# Patient Record
Sex: Female | Born: 2000 | Race: White | Hispanic: No | Marital: Single | Smoking: Never smoker
Health system: Southern US, Community
[De-identification: ages and names within clinical notes are randomized; demographics above are authoritative.]

## PROBLEM LIST (undated history)

## (undated) DIAGNOSIS — R55 Syncope and collapse: Secondary | ICD-10-CM

## (undated) DIAGNOSIS — F909 Attention-deficit hyperactivity disorder, unspecified type: Secondary | ICD-10-CM

## (undated) DIAGNOSIS — F419 Anxiety disorder, unspecified: Secondary | ICD-10-CM

## (undated) DIAGNOSIS — T753XXA Motion sickness, initial encounter: Secondary | ICD-10-CM

## (undated) HISTORY — PX: WISDOM TOOTH EXTRACTION: SHX21

---
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
## 2018-07-02 IMAGING — CT CT NECK W/ CM
4 of 5 series · 14 of 33 positions shown, 16 images · IV contrast (iopamidol)
Comparison: None.

CLINICAL DATA: Diagnosis with tonsillitis/pharyngitis yesterday.
Trouble swallowing.

EXAM:
CT NECK WITH CONTRAST
TECHNIQUE: Multidetector CT imaging of the neck was performed using the
standard protocol following the bolus administration of intravenous
contrast.
CONTRAST:  75mL [id number] IOPAMIDOL ([id number]) INJECTION 61%

[Series 2: axial neck · axial · 0.57mm/px · z∈[+115,+257]mm · 4 of 119 slices shown, 5 images]
[im 24/119  soft-tissue]
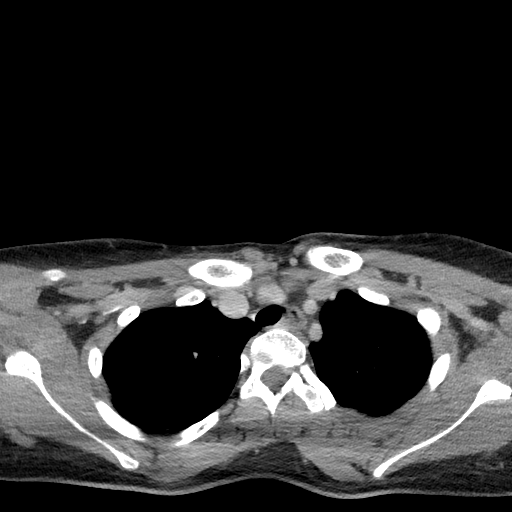
[im 24/119  bone]
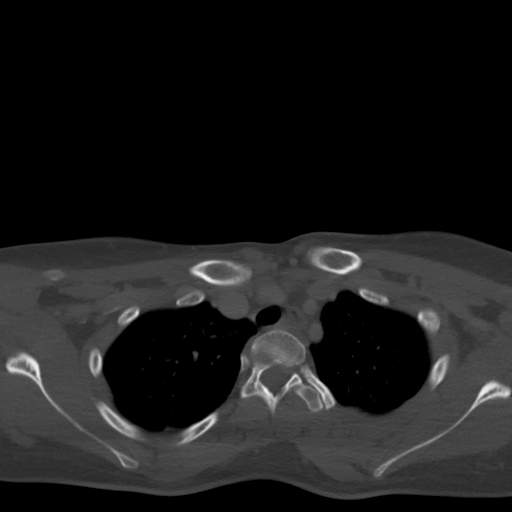
[im 48/119  bone]
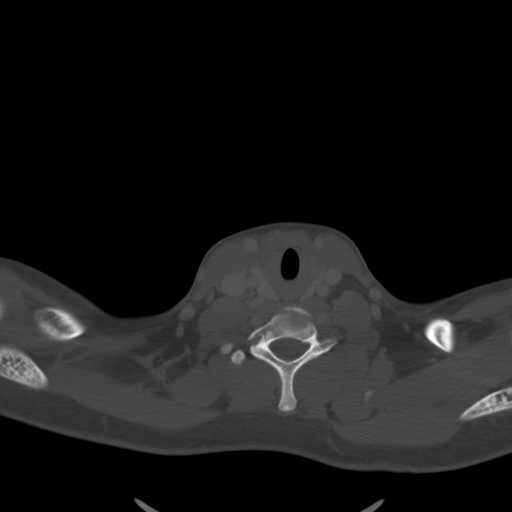
[im 71/119  bone]
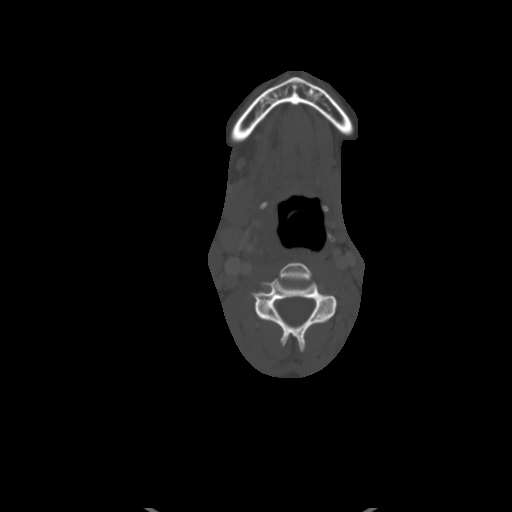
[im 95/119  bone]
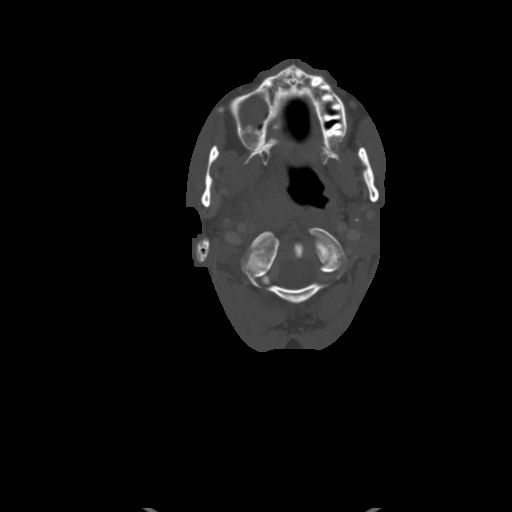

[Series 6: sag neck · sagittal · 0.44mm/px · 5 of 133 slices shown, 6 images]
[im 45/133  bone]
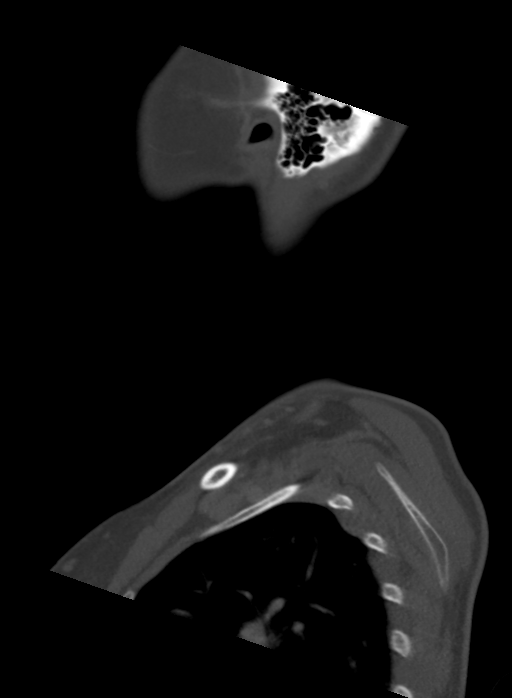
[im 56/133  bone]
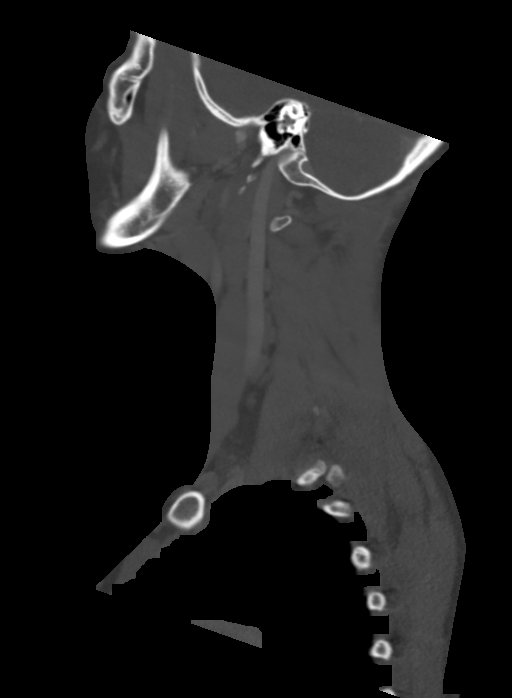
[im 67/133  soft-tissue]
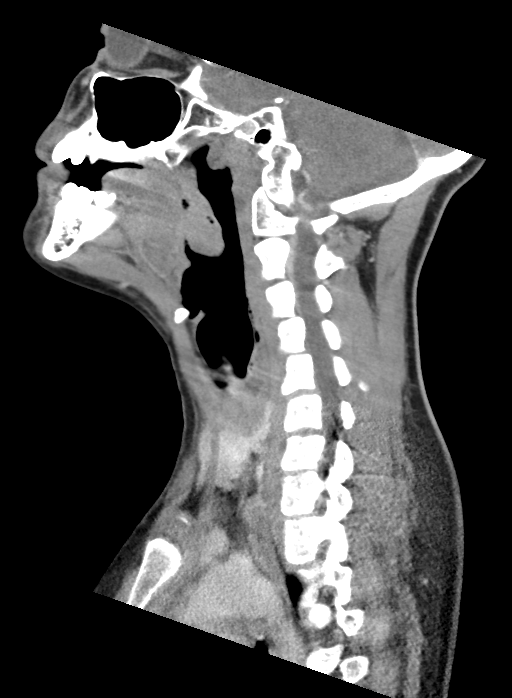
[im 67/133  bone]
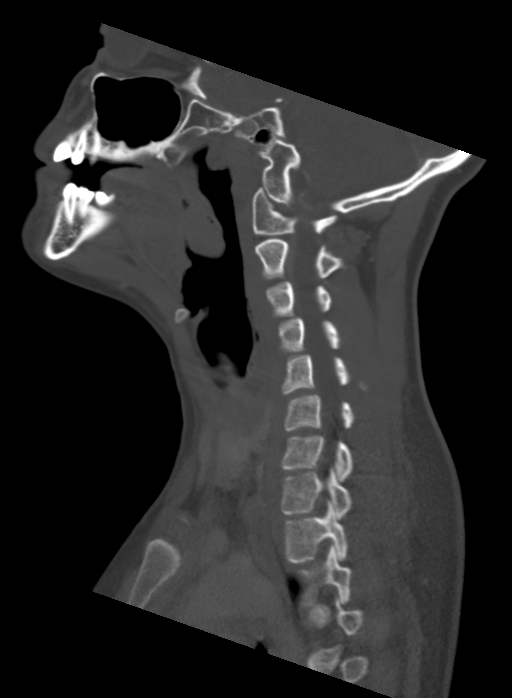
[im 78/133  bone]
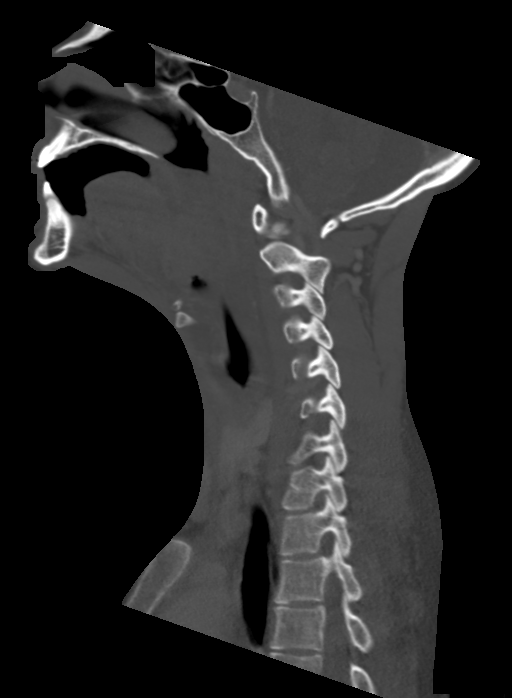
[im 89/133  bone]
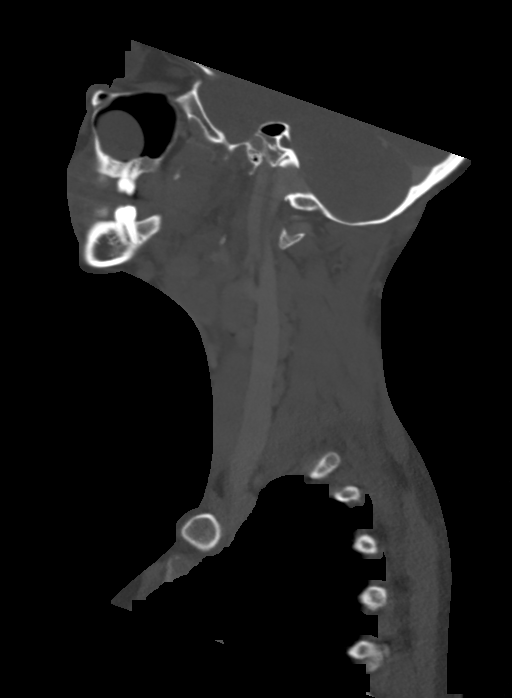

[Series 7: cor neck · coronal · 0.52mm/px · 3 of 82 slices shown]
[im 19/82  bone]
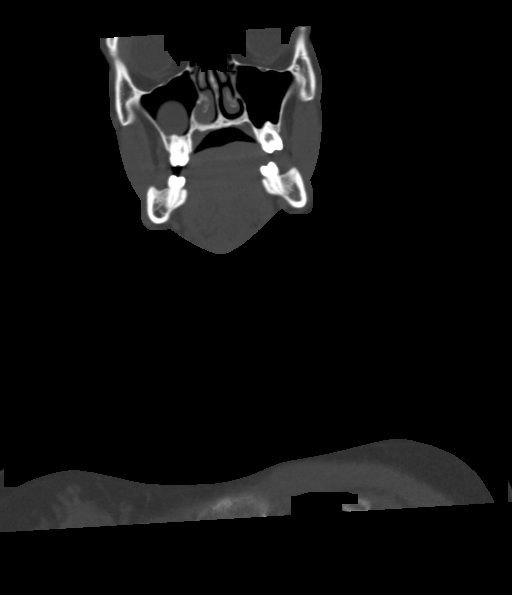
[im 34/82  bone]
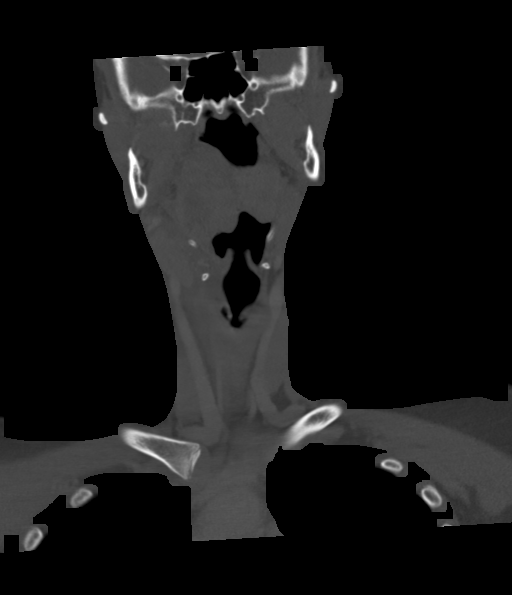
[im 48/82  bone]
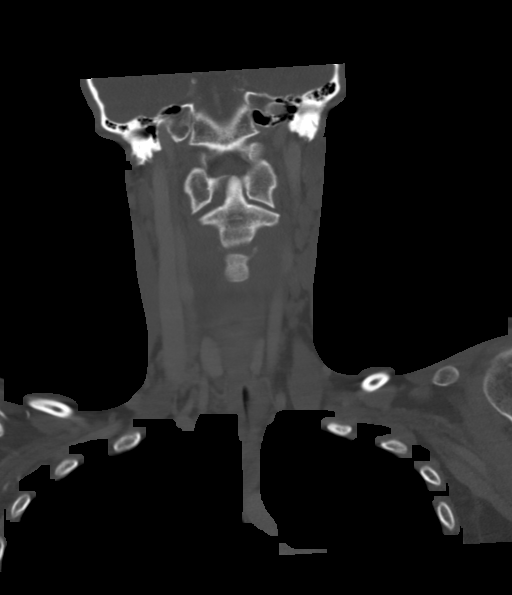

[Series 8: orthogonal ax · axial · 0.44mm/px · z∈[+82,+131]mm · 2 of 129 slices shown]
[im 26/129  bone]
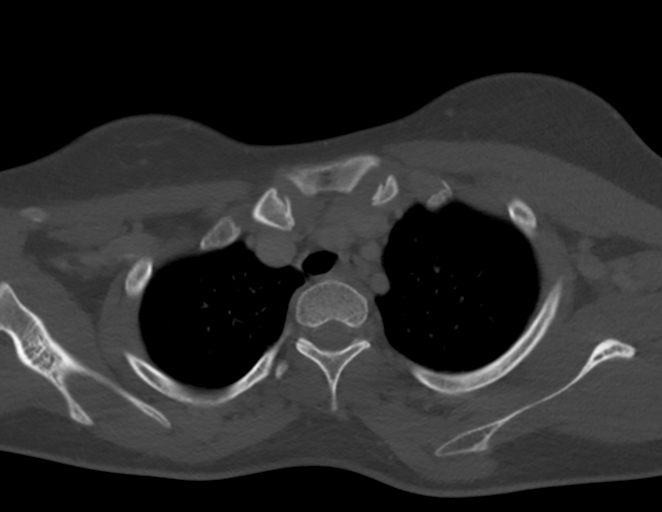
[im 52/129  bone]
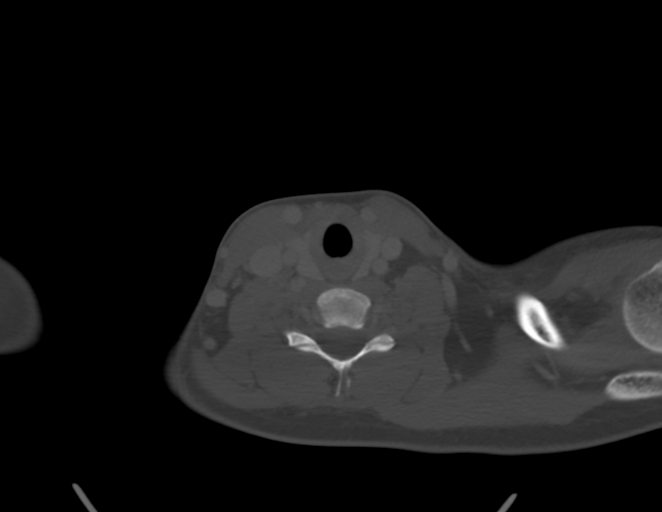

[14 of 33 positions shown; findings below may reference images not displayed]

FINDINGS: Pharynx and larynx: Tonsil thickening and hyperenhancement
correlating with the history. Asymmetric enlargement on the right
due to a peritonsillar low-density that is somewhat ill-defined and
some combination of abscess and phlegmon, in total measuring up to
26 mm craniocaudal abscess. The largest discrete low-density
component measures 16 mm. There is asymmetric contiguous submucosal
edema along the right pharyngeal wall. No epiglottic thickening or
airway stenosis.

Salivary glands: Right submandibular gland is asymmetrically
hyperenhancing, presumably secondary. No duct dilatation or stone
seen. No floor of mouth inflammation.

Thyroid: Normal

Lymph nodes: Asymmetric right-sided nodal enlargement without
cavitation.

Vascular: Negative for venous occlusion

Limited intracranial: Negative

Visualized orbits: Negative

Mastoids and visualized paranasal sinuses: Retention cysts in the
right maxillary sinus

Skeleton: No acute finding.

Upper chest: Negative
IMPRESSION: 1. Tonsillitis complicated by right peritonsillar abscess and
phlegmon in total measuring 26 mm with the most discrete fluid
density component measuring approximately 16 mm.
2. Cervical adenitis asymmetric to the right.
3. Right submandibular sialadenitis which is presumably secondary.
